# Patient Record
Sex: Female | Born: 1980 | Race: Black or African American | Hispanic: No | Smoking: Never smoker
Health system: Southern US, Community
[De-identification: ages and names within clinical notes are randomized; demographics above are authoritative.]

## PROBLEM LIST (undated history)

## (undated) DIAGNOSIS — E785 Hyperlipidemia, unspecified: Secondary | ICD-10-CM

## (undated) HISTORY — DX: Hyperlipidemia, unspecified: E78.5

---
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
## 2003-05-21 ENCOUNTER — Emergency Department (HOSPITAL_COMMUNITY): Admission: EM | Admit: 2003-05-21 | Discharge: 2003-05-21 | Payer: Self-pay | Admitting: Family Medicine

## 2003-08-01 ENCOUNTER — Emergency Department (HOSPITAL_COMMUNITY): Admission: EM | Admit: 2003-08-01 | Discharge: 2003-08-01 | Payer: Self-pay

## 2009-09-17 ENCOUNTER — Emergency Department (HOSPITAL_COMMUNITY): Admission: EM | Admit: 2009-09-17 | Discharge: 2009-09-17 | Payer: Self-pay | Admitting: Emergency Medicine

## 2009-10-17 ENCOUNTER — Other Ambulatory Visit: Admission: RE | Admit: 2009-10-17 | Discharge: 2009-10-17 | Payer: Self-pay | Admitting: Obstetrics and Gynecology

## 2010-04-06 ENCOUNTER — Inpatient Hospital Stay (HOSPITAL_COMMUNITY)
Admission: AD | Admit: 2010-04-06 | Discharge: 2010-04-11 | DRG: 372 | Disposition: A | Discharge status: Home / Self Care | Payer: BC Managed Care – PPO | Source: Ambulatory Visit | Attending: Obstetrics and Gynecology | Admitting: Obstetrics and Gynecology

## 2010-04-06 DIAGNOSIS — O4290 Premature rupture of membranes, unspecified as to length of time between rupture and onset of labor, unspecified weeks of gestation: Principal | ICD-10-CM | POA: Diagnosis present

## 2010-04-06 LAB — URINALYSIS, DIPSTICK ONLY
Bilirubin Urine: NEGATIVE
Ketones, ur: NEGATIVE mg/dL
Nitrite: POSITIVE — AB
pH: 6 (ref 5.0–8.0)

## 2010-04-06 LAB — CBC
Hemoglobin: 11 g/dL — ABNORMAL LOW (ref 12.0–15.0)
MCHC: 31.8 g/dL (ref 30.0–36.0)
Platelets: 275 10*3/uL (ref 150–400)
RBC: 4.32 MIL/uL (ref 3.87–5.11)
RDW: 14.1 % (ref 11.5–15.5)
WBC: 8.9 10*3/uL (ref 4.0–10.5)

## 2010-04-07 LAB — POCT I-STAT, CHEM 8
Creatinine, Ser: 0.7 mg/dL (ref 0.4–1.2)
HCT: 40 % (ref 36.0–46.0)
Hemoglobin: 13.6 g/dL (ref 12.0–15.0)
Sodium: 137 mEq/L (ref 135–145)
TCO2: 23 mmol/L (ref 0–100)

## 2010-04-07 LAB — URINALYSIS, ROUTINE W REFLEX MICROSCOPIC
Ketones, ur: NEGATIVE mg/dL
Nitrite: POSITIVE — AB
Specific Gravity, Urine: 1.008 (ref 1.005–1.030)
Urobilinogen, UA: 1 mg/dL (ref 0.0–1.0)
pH: 7.5 (ref 5.0–8.0)

## 2010-04-07 LAB — URINE MICROSCOPIC-ADD ON

## 2010-04-07 LAB — WET PREP, GENITAL
Clue Cells Wet Prep HPF POC: NONE SEEN
WBC, Wet Prep HPF POC: NONE SEEN

## 2010-04-07 LAB — HCG, QUANTITATIVE, PREGNANCY: hCG, Beta Chain, Quant, S: 39215 m[IU]/mL — ABNORMAL HIGH (ref <5)

## 2010-04-07 LAB — RPR: RPR Ser Ql: NONREACTIVE

## 2010-04-09 LAB — CBC
HCT: 33 % — ABNORMAL LOW (ref 36.0–46.0)
Hemoglobin: 10.4 g/dL — ABNORMAL LOW (ref 12.0–15.0)
MCHC: 31.5 g/dL (ref 30.0–36.0)
MCV: 80.9 fL (ref 78.0–100.0)
Platelets: 237 10*3/uL (ref 150–400)

## 2010-04-11 ENCOUNTER — Encounter (HOSPITAL_COMMUNITY)
Admission: RE | Admit: 2010-04-11 | Discharge: 2010-04-11 | Disposition: A | Discharge status: Home / Self Care | Payer: BC Managed Care – PPO | Source: Ambulatory Visit | Attending: Obstetrics and Gynecology | Admitting: Obstetrics and Gynecology

## 2010-04-11 DIAGNOSIS — O923 Agalactia: Secondary | ICD-10-CM | POA: Insufficient documentation

## 2010-05-08 ENCOUNTER — Inpatient Hospital Stay (HOSPITAL_COMMUNITY): Admission: AD | Admit: 2010-05-08 | Payer: Self-pay | Source: Ambulatory Visit | Admitting: Obstetrics and Gynecology

## 2010-05-12 ENCOUNTER — Encounter (HOSPITAL_COMMUNITY)
Admission: RE | Admit: 2010-05-12 | Discharge: 2010-05-12 | Disposition: A | Discharge status: Home / Self Care | Payer: BC Managed Care – PPO | Source: Ambulatory Visit | Attending: Obstetrics and Gynecology | Admitting: Obstetrics and Gynecology

## 2010-05-12 DIAGNOSIS — O923 Agalactia: Secondary | ICD-10-CM | POA: Insufficient documentation

## 2012-04-12 IMAGING — US US OB COMP LESS 14 WK
1 series · 13 of 28 positions shown · non-contrast
Comparison: None available.

CLINICAL DATA: G4 P1, LMP 05/08/2010 ([DATE] weeks 5 days), presenting
with vaginal bleeding and cramping.

OBSTETRIC <14 WK US AND TRANSVAGINAL OB US 09/17/2009:
TECHNIQUE: Both transabdominal and transvaginal ultrasound
examinations were performed for complete evaluation of the
gestation as well as the maternal uterus, adnexal regions, and
pelvic cul-de-sac.  Transvaginal technique was performed to assess
early pregnancy.

[Series 1: us ob comp less 14 wk · 0.28mm/px · 13 of 49 slices shown]
[im 2/49]
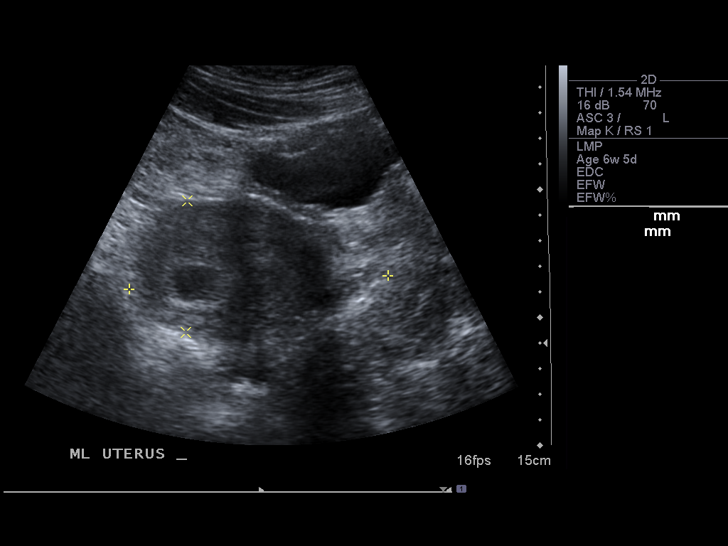
[im 6/49]
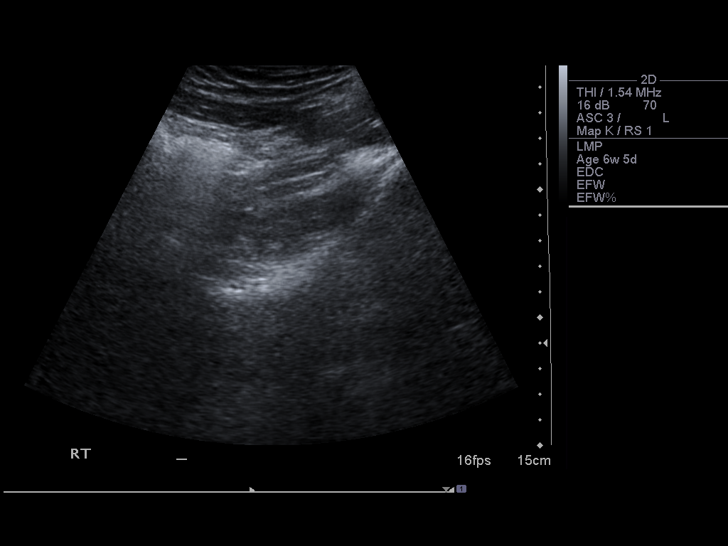
[im 9/49]
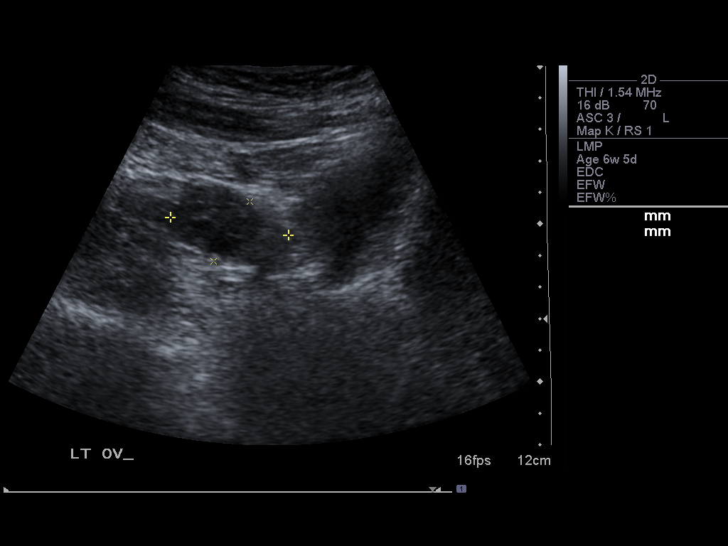
[im 13/49]
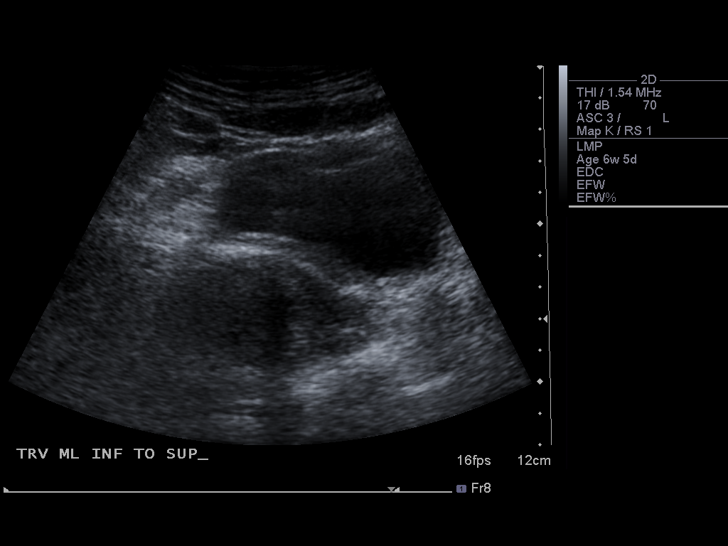
[im 17/49]
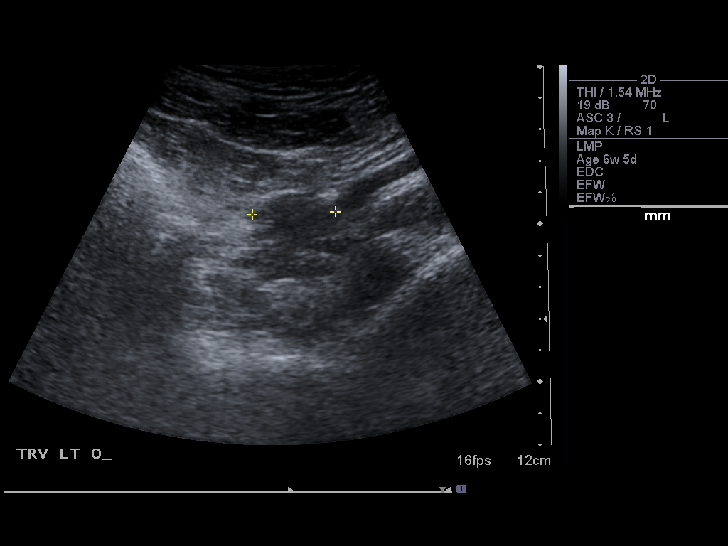
[im 20/49]
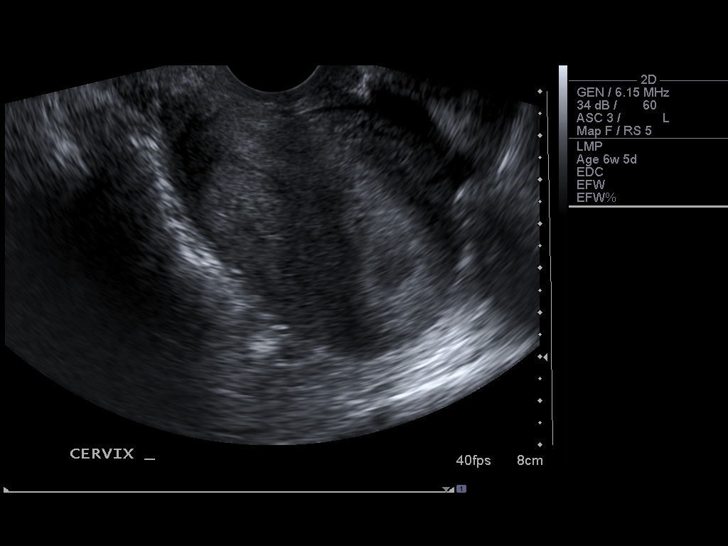
[im 25/49]
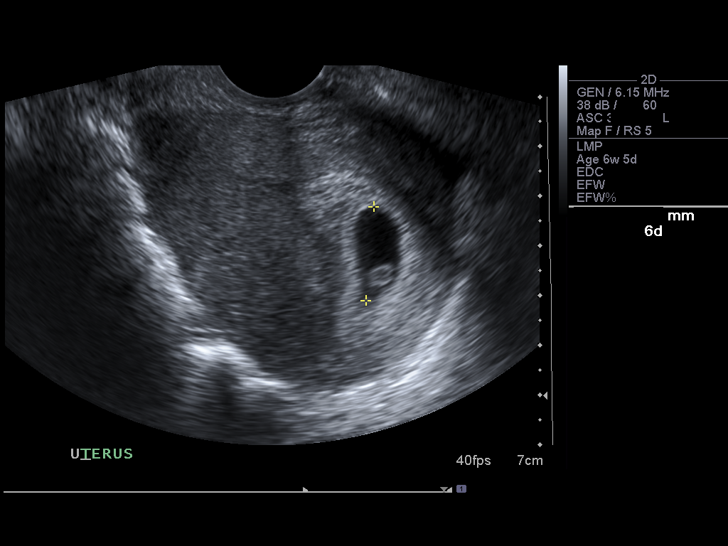
[im 29/49]
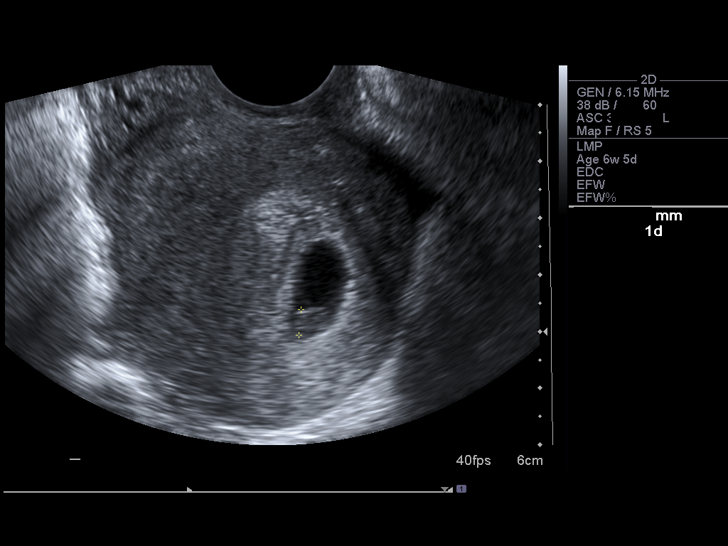
[im 33/49]
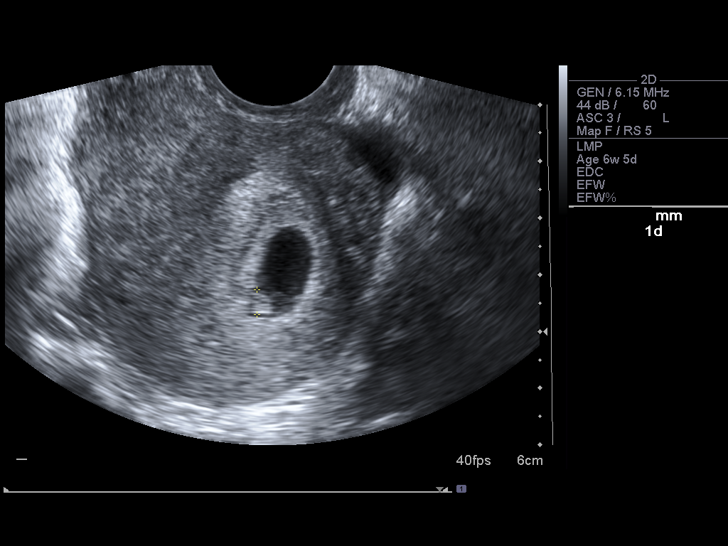
[im 36/49]
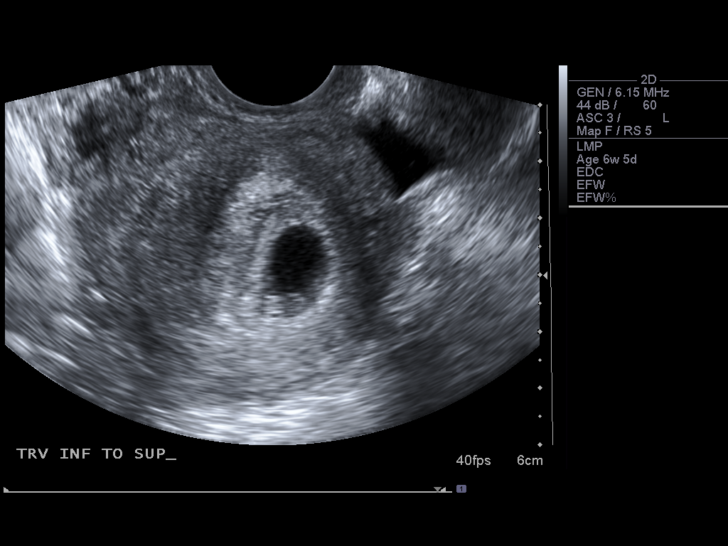
[im 40/49]
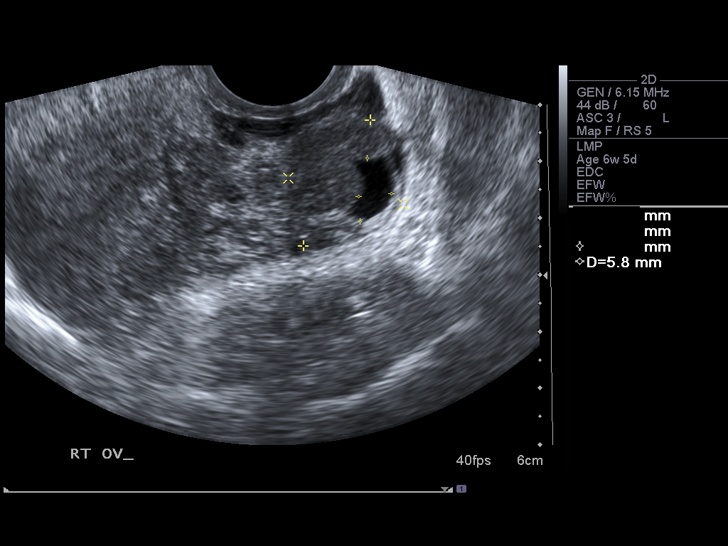
[im 43/49]
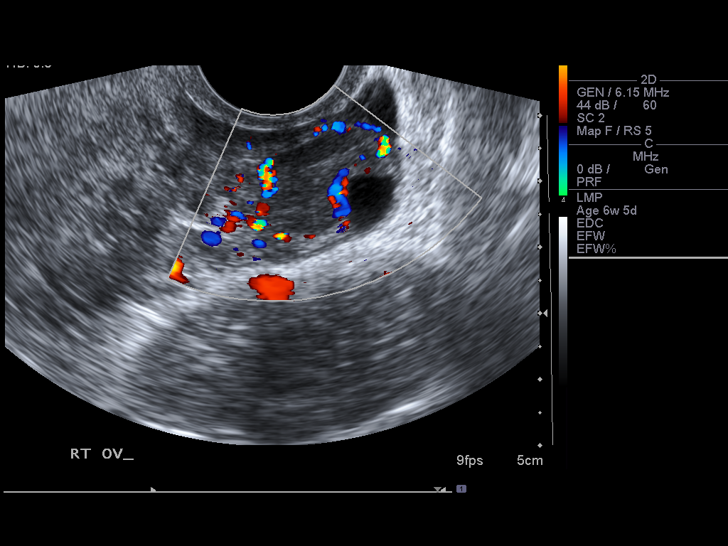
[im 47/49]
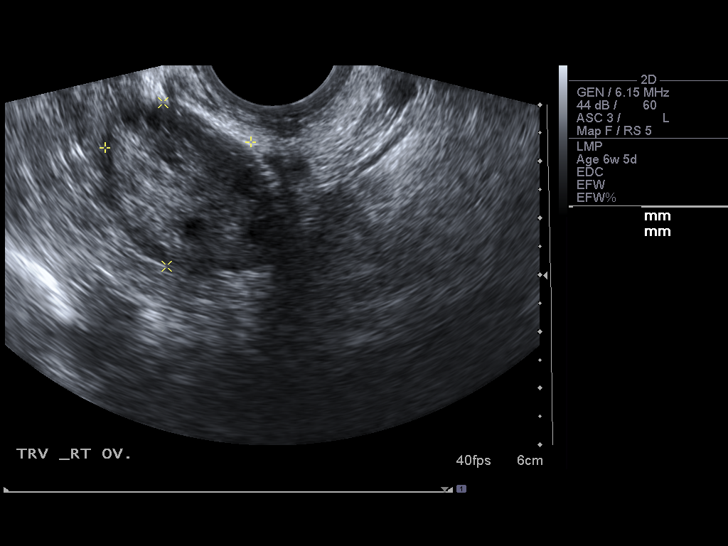

[13 of 28 positions shown; findings below may reference images not displayed]

Intrauterine gestational sac:  Single gestational sac with normal
oval shape.
Yolk sac: Visualized.
Embryo: Visualized.
Cardiac Activity: Visualized.
Heart Rate: 147 bpm

MSD: 19.6 mm  6 w 6 d
CRL: 4.4 mm  6 w 1 d
Assigned ultrasound gestational age:  6 w 4 d   US EDC: 05/09/2010.

Maternal uterus/adnexae:
Small subchorionic hemorrhage.  Normal-appearing right ovary
containing several small follicular cysts, demonstrating normal
color Doppler signal.  Approximate 1.7 cm involuting corpus luteum
cyst within the left ovary; left ovary demonstrates mild hyperemia
on color Doppler evaluation.  Retroverted, retroflexed uterus.  No
adnexal masses or free fluid.
IMPRESSION: 1.  Single live intrauterine embryo with estimated gestational age
6 weeks 4 days, corresponding very well the estimated gestational
age normal.  PACS weeks 5 days.  Ultrasound EDC 05/09/2010.
2.  Small subchorionic hemorrhage.
3.  Involuting corpus luteum cyst in the left ovary.
4.  Retroverted, retroflexed uterus.

## 2014-06-10 ENCOUNTER — Ambulatory Visit (INDEPENDENT_AMBULATORY_CARE_PROVIDER_SITE_OTHER): Payer: BLUE CROSS/BLUE SHIELD | Admitting: Emergency Medicine

## 2014-06-10 VITALS — BP 120/72 | HR 65 | Temp 98.4°F | Resp 18 | Ht 69.0 in | Wt 268.0 lb

## 2014-06-10 DIAGNOSIS — R079 Chest pain, unspecified: Secondary | ICD-10-CM

## 2014-06-10 NOTE — Progress Notes (Signed)
Subjective:  Patient ID: Terri Jones, female    DOB: [birth year]  Age: 34 y.o. MRN: [id number]  CC: Chest Pain   HPI Terri Jones presents for evaluation of chest pain. She describes having  chest pain since last night. She said the pain began while she was lying down. She said the pain decreases when she sits up. Describes the pain as a sharp pain does not radiate. There is no associated shortness of breath nausea or diaphoresis.  She has no history of hypertension diabetes mellitus or high cholesterol. She's nonsmoker. She works in [employer]. She does not do heavy lifting or mechanical work  Visit pain comes and goes. And is not affected by activity posture other than lying and sitting   No improvement with over the counter medications or other home remedies. Denies other complaint or health concern today.  No outpatient prescriptions prior to visit.   No facility-administered medications prior to visit.    ROS Review of Systems  Constitutional: Negative for fever, chills and appetite change.  HENT: Negative for congestion, ear pain, postnasal drip, sinus pressure and sore throat.   Eyes: Negative for pain and redness.  Respiratory: Negative for cough, shortness of breath and wheezing.   Cardiovascular: Positive for chest pain. Negative for palpitations and leg swelling.  Gastrointestinal: Negative for nausea, vomiting, abdominal pain, diarrhea, constipation and blood in stool.  Endocrine: Negative for polyuria.  Genitourinary: Negative for dysuria, urgency, frequency and flank pain.  Musculoskeletal: Negative for gait problem.  Skin: Negative for rash.  Neurological: Negative for weakness and headaches.  Psychiatric/Behavioral: Negative for confusion and decreased concentration. The patient is not nervous/anxious.     Objective:  BP 120/72 mmHg  Pulse 65  Temp(Src) 98.4 F (36.9 C) (Oral)  Resp 18  Ht 5\' 9"  (1.753 m)  Wt 268 lb (121.564 kg)  BMI 39.56  kg/m2  SpO2 98%  LMP 05/18/2014  BP Readings from Last 3 Encounters:  06/10/14 120/72    Wt Readings from Last 3 Encounters:  06/10/14 268 lb (121.564 kg)    Physical Exam  Constitutional: She is oriented to person, place, and time. Vital signs are normal. She appears well-developed and well-nourished. No distress.  HENT:  Head: Normocephalic and atraumatic.  Right Ear: External ear normal.  Left Ear: External ear normal.  Nose: Nose normal.  Eyes: Conjunctivae and EOM are normal. Pupils are equal, round, and reactive to light. No scleral icterus.  Neck: Normal range of motion. Neck supple. No tracheal deviation present.  Cardiovascular: Normal rate, regular rhythm and normal heart sounds.   Pulmonary/Chest: Effort normal. No respiratory distress. She has no wheezes. She has no rales. She exhibits no tenderness.  Abdominal: She exhibits no mass. There is no tenderness. There is no rebound and no guarding.  Musculoskeletal: She exhibits no edema.  Lymphadenopathy:    She has no cervical adenopathy.  Neurological: She is alert and oriented to person, place, and time. Coordination normal.  Skin: Skin is warm and dry. No rash noted.  Psychiatric: She has a normal mood and affect. Her behavior is normal.    Lab Results  Component Value Date   WBC 8.2 04/09/2010   HGB 10.4* 04/09/2010   HCT 33.0* 04/09/2010   PLT 237 04/09/2010   GLUCOSE 84 09/17/2009   NA 137 09/17/2009   K 4.0 09/17/2009   CL 105 09/17/2009   CREATININE 0.7 09/17/2009   BUN 6 09/17/2009      Assessment & Plan:  Terri Jones was seen today for chest pain.  Diagnoses and all orders for this visit:  Chest pain, unspecified chest pain type Orders: -     EKG 12-Lead -     Ambulatory referral to Cardiology   I am having Ms. Donnell maintain her PRAVASTATIN SODIUM PO.  Meds ordered this encounter  Medications  . PRAVASTATIN SODIUM PO    Sig: Take by mouth.     Follow-up: Return if symptoms  worsen or fail to improve.  Discussed red flag concerns with her as well as appropriate action to take.  Carmelina DaneAnderson, Jeffery S, MD

## 2014-07-20 ENCOUNTER — Ambulatory Visit (INDEPENDENT_AMBULATORY_CARE_PROVIDER_SITE_OTHER): Payer: BLUE CROSS/BLUE SHIELD | Admitting: Family Medicine

## 2014-07-20 VITALS — BP 130/80 | HR 63 | Temp 98.7°F | Resp 16 | Ht 68.0 in | Wt 272.0 lb

## 2014-07-20 DIAGNOSIS — R6883 Chills (without fever): Secondary | ICD-10-CM | POA: Diagnosis not present

## 2014-07-20 LAB — POCT RAPID STREP A (OFFICE): Rapid Strep A Screen: NEGATIVE

## 2014-07-20 NOTE — Progress Notes (Signed)
[facility] [address], [place] [id number] [phone]  Date:  07/20/2014   Name:  Terri CarnesBrandy Cassady   DOB:  Feb 12, 1980   MRN:  [id number]  PCP:  No primary care provider on file.    Chief Complaint: Exposure to strep   History of Present Illness:  Terri Jones is a 34 y.o. very pleasant female patient who presents with the following:  Her son was dx with strep yesterday.   Her stepson also has mono right now She does not have a ST and thinks she is ok, but was "kicked out of work because her co-worker was concerned about a fever." her co-worker thought that she felt hot to the touch.   She feels ok- no cough, no runny nose No GI symtpoms.    There are no active problems to display for this patient.   Past Medical History  Diagnosis Date  . Hyperlipidemia     History reviewed. No pertinent past surgical history.  History  Substance Use Topics  . Smoking status: Never Smoker   . Smokeless tobacco: Not on file  . Alcohol Use: No    Family History  Problem Relation Age of Onset  . Cancer Mother     No Known Allergies  Medication list has been reviewed and updated.  Current Outpatient Prescriptions on File Prior to Visit  Medication Sig Dispense Refill  . PRAVASTATIN SODIUM PO Take by mouth.     No current facility-administered medications on file prior to visit.    Review of Systems:  As per HPI- otherwise negative.   Physical Examination: Filed Vitals:   07/20/14 1245  BP: 130/80  Pulse: 63  Temp: 98.7 F (37.1 C)  Resp: 16   Filed Vitals:   07/20/14 1245  Height: 5\' 8"  (1.727 m)  Weight: 272 lb (123.378 kg)   Body mass index is 41.37 kg/(m^2). Ideal Body Weight: Weight in (lb) to have BMI = 25: 164.1  GEN: WDWN, NAD, Non-toxic, A & O x 3, obese, looks well HEENT: Atraumatic, Normocephalic. Neck supple. No masses, No LAD.  Bilateral TM wnl, oropharynx normal.  PEERL,EOMI.   Ears and Nose: No external  deformity. CV: RRR, No M/G/R. No JVD. No thrill. No extra heart sounds. PULM: CTA B, no wheezes, crackles, rhonchi. No retractions. No resp. distress. No accessory muscle use. EXTR: No c/c/e NEURO Normal gait.  PSYCH: Normally interactive. Conversant. Not depressed or anxious appearing.  Calm demeanor.   Results for orders placed or performed in visit on 07/20/14  POCT rapid strep A  Result Value Ref Range   Rapid Strep A Screen Negative Negative    Assessment and Plan: Chills - Plan: POCT rapid strep A  Negative strep.  She will keep an eye on any symptoms and let me know if she starts to feel bad OW she may return to work tomorrow   Signed Abbe AmsterdamJessica Gerell Fortson, MD

## 2014-07-20 NOTE — Patient Instructions (Signed)
Let us know if you develop any symptoms such as sore throat, earache, fever, or fatigue.

## 2014-07-23 ENCOUNTER — Ambulatory Visit: Payer: Self-pay | Admitting: Internal Medicine

## 2014-09-24 ENCOUNTER — Ambulatory Visit: Payer: Self-pay | Admitting: Internal Medicine

## 2014-09-29 ENCOUNTER — Encounter: Payer: Self-pay | Admitting: Internal Medicine
